# Patient Record
Sex: Male | Born: 2000
Health system: Southern US, Community
[De-identification: ages and names within clinical notes are randomized; demographics above are authoritative.]

## PROBLEM LIST (undated history)

## (undated) HISTORY — PX: ADENOIDECTOMY: SUR15

## (undated) HISTORY — PX: TONSILLECTOMY: SUR1361

---
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
## 2023-02-16 ENCOUNTER — Encounter: Payer: Self-pay | Admitting: Sports Medicine

## 2023-02-16 ENCOUNTER — Ambulatory Visit: Payer: BC Managed Care – PPO | Admitting: Family Medicine

## 2023-02-16 ENCOUNTER — Ambulatory Visit (INDEPENDENT_AMBULATORY_CARE_PROVIDER_SITE_OTHER): Payer: 59 | Admitting: Sports Medicine

## 2023-02-16 VITALS — BP 120/82 | HR 71 | Ht 76.0 in | Wt 163.0 lb

## 2023-02-16 DIAGNOSIS — M51369 Other intervertebral disc degeneration, lumbar region without mention of lumbar back pain or lower extremity pain: Secondary | ICD-10-CM | POA: Insufficient documentation

## 2023-02-16 DIAGNOSIS — Z025 Encounter for examination for participation in sport: Secondary | ICD-10-CM | POA: Diagnosis not present

## 2023-02-16 DIAGNOSIS — M5136 Other intervertebral disc degeneration, lumbar region: Secondary | ICD-10-CM

## 2023-02-16 DIAGNOSIS — M25511 Pain in right shoulder: Secondary | ICD-10-CM | POA: Diagnosis not present

## 2023-02-16 NOTE — Assessment & Plan Note (Signed)
DDD noted on MRI, adding some home conditioning, this is the only thing he would really change about his health.

## 2023-02-16 NOTE — Progress Notes (Signed)
    Procedures performed today:    None.  Independent interpretation of notes and tests performed by another provider:   None.  Brief History, Exam, Impression, and Recommendations:    Sports physical Sports physical performed as above. Form filled out for college.  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 No trauma, no changes in activity. His exam is completely normal. We will hold off on imaging, we will have him do some home conditioning and return to see me if not better in about 6 weeks.  Lumbar degenerative disc disease DDD noted on MRI, adding some home conditioning, this is the only thing he would really change about his health.  I spent 30 minutes of total time managing this patient today, this includes chart review, face to face, and non-face to face time.  ____________________________________________ [name]. [name], M.D., ABFM., CAQSM., AME. Primary Care [facility] [place] [facility]  Adjunct Professor of Family Medicine  [employer] [facility] [employer]  Restaurant manager, fast food

## 2023-02-16 NOTE — Assessment & Plan Note (Signed)
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 No trauma, no changes in activity. His exam is completely normal. We will hold off on imaging, we will have him do some home conditioning and return to see me if not better in about 6 weeks.

## 2023-02-16 NOTE — Assessment & Plan Note (Signed)
Sports physical performed as above. Form filled out for college.

## 2023-10-20 ENCOUNTER — Telehealth: Admitting: Sports Medicine

## 2023-10-20 ENCOUNTER — Encounter: Payer: Self-pay | Admitting: Sports Medicine

## 2023-10-20 DIAGNOSIS — M25522 Pain in left elbow: Secondary | ICD-10-CM

## 2023-10-20 DIAGNOSIS — G8929 Other chronic pain: Secondary | ICD-10-CM

## 2023-10-20 NOTE — Assessment & Plan Note (Signed)
 This is a very pleasant 23 year old male, he is a college baseball pitcher, left handed, for several months now he has had discomfort left elbow medial aspect. He did see an outside orthopedist, was noted to have the typical glenohumeral internal rotation deficit, therapy was helpful to regain his range of motion, shoulder discomfort resolved, unfortunately still has discomfort at the medial elbow, the visit was virtual some unable to conduct a physical exam however based on directing the patient he has no tenderness at the medial epicondyle to palpation, no pain to resisted pronation or extension of the wrist, he does endorse reproduction of pain with the application of valgus stress to the elbow suggesting an ulnar collateral ligament injury. No paresthesias to the hand or fingers to suggest an ulnar neuropathy. As his symptoms have been going on for several months I think is reasonable to proceed with advanced imaging such as an MRI, I did tell him that treatment initially revolves around relative rest, as well as aggressive physical therapy of the common flexor pronator mass. If this fails then treatment such as PRP injection around the UCL can be helpful, and if this fails then and only then should operative intervention be considered. He will work with his [employer] team doc to discuss bracing, analgesics, therapy, and activity modification as well as diagnostic imaging.

## 2023-10-20 NOTE — Progress Notes (Signed)
 Virtual Visit via WebEx/MyChart   I connected with  Thomas Sherman  on 10/20/23 via WebEx/MyChart/Doximity Video and verified that I am speaking with the correct person using two identifiers.   I discussed the limitations, risks, security and privacy concerns of performing an evaluation and management service by WebEx/MyChart/Doximity Video, including the higher likelihood of inaccurate diagnosis and treatment, and the availability of in person appointments.  We also discussed the likely need of an additional face to face encounter for complete and high quality delivery of care.  I also discussed with the patient that there may be a patient responsible charge related to this service. The patient expressed understanding and wishes to proceed.  Provider location is in medical facility. Patient location is at their home, different from provider location. People involved in care of the patient during this telehealth encounter were myself, my nurse/medical assistant, and my front office/scheduling team member.  Review of Systems: No fevers, chills, night sweats, weight loss, chest pain, or shortness of breath.   Objective Findings:    General: Speaking full sentences, no audible heavy breathing.  Sounds alert and appropriately interactive.  Appears well.  Face symmetric.  Extraocular movements intact.  Pupils equal and round.  No nasal flaring or accessory muscle use visualized.  Independent interpretation of tests performed by another provider:   None.  Brief History, Exam, Impression, and Recommendations:    Chronic elbow pain, left This is a very pleasant 23 year old male, he is a college baseball pitcher, left handed, for several months now he has had discomfort left elbow medial aspect. He did see an outside orthopedist, was noted to have the typical glenohumeral internal rotation deficit, therapy was helpful to regain his range of motion, shoulder discomfort resolved, unfortunately  still has discomfort at the medial elbow, the visit was virtual some unable to conduct a physical exam however based on directing the patient he has no tenderness at the medial epicondyle to palpation, no pain to resisted pronation or extension of the wrist, he does endorse reproduction of pain with the application of valgus stress to the elbow suggesting an ulnar collateral ligament injury. No paresthesias to the hand or fingers to suggest an ulnar neuropathy. As his symptoms have been going on for several months I think is reasonable to proceed with advanced imaging such as an MRI, I did tell him that treatment initially revolves around relative rest, as well as aggressive physical therapy of the common flexor pronator mass. If this fails then treatment such as PRP injection around the UCL can be helpful, and if this fails then and only then should operative intervention be considered. He will work with his [employer] team doc to discuss bracing, analgesics, therapy, and activity modification as well as diagnostic imaging.   I discussed the above assessment and treatment plan with the patient. The patient was provided an opportunity to ask questions and all were answered. The patient agreed with the plan and demonstrated an understanding of the instructions.   The patient was advised to call back or seek an in-person evaluation if the symptoms worsen or if the condition fails to improve as anticipated.   I provided 30 minutes of face to face and non-face-to-face time during this encounter date, time was needed to gather information, review chart, records, communicate/coordinate with staff remotely, as well as complete documentation.   ____________________________________________ Thomas Sherman. Thomas Sherman, M.D., ABFM., CAQSM., AME. Primary Care and Sports Medicine [place] [facility]  Adjunct Professor of [facility] Medicine  [employer]  [place]

## 2024-03-08 ENCOUNTER — Encounter: Payer: Self-pay | Admitting: Sports Medicine
